# Patient Record
Sex: Male | Born: 2000 | Race: Black or African American | Hispanic: No | Marital: Single | Smoking: Never smoker
Health system: Southern US, Community
[De-identification: ages and names within clinical notes are randomized; demographics above are authoritative.]

## PROBLEM LIST (undated history)

## (undated) DIAGNOSIS — M62838 Other muscle spasm: Secondary | ICD-10-CM

---
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
## 2021-11-12 ENCOUNTER — Encounter (HOSPITAL_BASED_OUTPATIENT_CLINIC_OR_DEPARTMENT_OTHER): Payer: Self-pay | Admitting: Obstetrics and Gynecology

## 2021-11-12 ENCOUNTER — Emergency Department (HOSPITAL_BASED_OUTPATIENT_CLINIC_OR_DEPARTMENT_OTHER)
Admission: EM | Admit: 2021-11-12 | Discharge: 2021-11-12 | Disposition: A | Discharge status: Home / Self Care | Payer: Medicaid Other | Attending: Emergency Medicine | Admitting: Emergency Medicine

## 2021-11-12 ENCOUNTER — Emergency Department (HOSPITAL_BASED_OUTPATIENT_CLINIC_OR_DEPARTMENT_OTHER): Payer: Medicaid Other | Admitting: Radiology

## 2021-11-12 ENCOUNTER — Other Ambulatory Visit: Payer: Self-pay

## 2021-11-12 DIAGNOSIS — M545 Low back pain, unspecified: Secondary | ICD-10-CM | POA: Insufficient documentation

## 2021-11-12 HISTORY — DX: Other muscle spasm: M62.838

## 2021-11-12 MED ORDER — IBUPROFEN 800 MG PO TABS
800.0000 mg | ORAL_TABLET | Freq: Once | ORAL | Status: AC
  Administered 2021-11-12: 800 mg via ORAL
  Filled 2021-11-12: qty 1

## 2021-11-12 MED ORDER — METHOCARBAMOL 500 MG PO TABS: 500.0000 mg | ORAL_TABLET | Freq: Two times a day (BID) | ORAL | 0 refills | Status: AC

## 2021-11-12 MED ORDER — LIDOCAINE 5 % EX PTCH
1.0000 | MEDICATED_PATCH | CUTANEOUS | Status: DC
  Administered 2021-11-12: 1 via TRANSDERMAL
  Filled 2021-11-12: qty 1

## 2021-11-12 NOTE — Discharge Instructions (Addendum)
Take the roboxin at night, don't take before driving. You may hav e some muscle pain over the next few weeks, this is normal. Take tylenol and motrin for pain.  ?
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
## 2021-11-12 NOTE — ED Notes (Signed)
Discharge instructions, follow up care, and prescriptions reviewed and explained, pt verbalized understanding.  
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
## 2021-11-28 ENCOUNTER — Encounter (HOSPITAL_COMMUNITY): Payer: Self-pay | Admitting: Emergency Medicine

## 2021-11-28 ENCOUNTER — Ambulatory Visit (HOSPITAL_COMMUNITY)
Admission: EM | Admit: 2021-11-28 | Discharge: 2021-11-28 | Disposition: A | Discharge status: Home / Self Care | Payer: Medicaid Other | Attending: Nurse Practitioner | Admitting: Nurse Practitioner

## 2021-11-28 DIAGNOSIS — R110 Nausea: Secondary | ICD-10-CM | POA: Diagnosis not present

## 2021-11-28 DIAGNOSIS — Z20822 Contact with and (suspected) exposure to covid-19: Secondary | ICD-10-CM | POA: Insufficient documentation

## 2021-11-28 DIAGNOSIS — R197 Diarrhea, unspecified: Secondary | ICD-10-CM | POA: Insufficient documentation

## 2021-11-28 MED ORDER — ONDANSETRON 4 MG PO TBDP: 4.0000 mg | ORAL_TABLET | Freq: Three times a day (TID) | ORAL | 0 refills | Status: AC | PRN

## 2021-11-28 NOTE — ED Provider Notes (Signed)
?[facility] ? ? ? ?CSN: [id number] ?Arrival date & time: 11/28/21  1048 ? ? ?  ? ?History   ?Chief Complaint ?Chief Complaint  ?Patient presents with  ? Covid Exposure  ? Nausea  ? ? ?HPI ?Shane Reed is a 21 y.o. male.  ? ?Patient reports he was exposed to somebody who tested positive for COVID over the weekend.  He reports symptoms started on Monday.  He reports nausea and frequent diarrhea, body aches, chills, and congestion at nighttime.  He denies cough, headache, sinus pressure, ear pain or drainage, nausea/vomiting.  Denies chest pain shortness of breath.  He is having diarrhea however denies blood in his stool.  He is eating and drinking normally.  He has not taken anything for his upper respiratory symptoms.  He reports he missed work yesterday. ? ? ?Past Medical History:  ?Diagnosis Date  ? Muscle spasms of lower extremity   ? ? ?There are no problems to display for this patient. ? ? ?History reviewed. No pertinent surgical history. ? ? ? ? ?Home Medications   ? ?Prior to Admission medications   ?Medication Sig Start Date End Date Taking? Authorizing Provider  ?ibuprofen (ADVIL,MOTRIN) 400 MG tablet Take 1 tablet (400 mg total) by mouth every 6 (six) hours as needed. 05/29/18   Lorin Picket, NP  ?methocarbamol (ROBAXIN) 500 MG tablet Take 1 tablet (500 mg total) by mouth 2 (two) times daily. 11/12/21   Theron Arista, PA-C  ?ondansetron (ZOFRAN ODT) 4 MG disintegrating tablet Take 1 tablet (4 mg total) by mouth every 8 (eight) hours as needed for nausea or vomiting. 11/28/21   Valentino Nose, NP  ? ? ?Family History ?Family History  ?Problem Relation Age of Onset  ? Healthy Mother   ? Healthy Father   ? ? ?Social History ?Social History  ? ?Tobacco Use  ? Smoking status: Never  ? Smokeless tobacco: Never  ?Vaping Use  ? Vaping Use: Never used  ?Substance Use Topics  ? Alcohol use: Never  ? Drug use: Yes  ?  Types: Marijuana  ? ? ? ?Allergies   ?Patient has no known  allergies. ? ? ?Review of Systems ?Review of Systems ?Per HPI ? ?Physical Exam ?Triage Vital Signs ?ED Triage Vitals  ?Enc Vitals Group  ?   BP 11/28/21 1237 (!) 94/54  ?   Pulse Rate 11/28/21 1237 61  ?   Resp 11/28/21 1237 14  ?   Temp 11/28/21 1237 99.2 ?F (37.3 ?C)  ?   Temp Source 11/28/21 1237 Oral  ?   SpO2 11/28/21 1237 98 %  ?   Weight --   ?   Height --   ?   Head Circumference --   ?   Peak Flow --   ?   Pain Score 11/28/21 1236 0  ?   Pain Loc --   ?   Pain Edu? --   ?   Excl. in GC? --   ? ?No data found. ? ?Updated Vital Signs ?BP (!) 94/54 (BP Location: Right Arm)   Pulse 61   Temp 99.2 ?F (37.3 ?C) (Oral)   Resp 14   SpO2 98%  ? ?Visual Acuity ?Right Eye Distance:   ?Left Eye Distance:   ?Bilateral Distance:   ? ?Right Eye Near:   ?Left Eye Near:    ?Bilateral Near:    ? ?Physical Exam ?Vitals and nursing note reviewed.  ?Constitutional:   ?  General: He is not in acute distress. ?   Appearance: Normal appearance. He is not ill-appearing or toxic-appearing.  ?HENT:  ?   Head: Normocephalic and atraumatic.  ?   Right Ear: Tympanic membrane, ear canal and external ear normal.  ?   Left Ear: Tympanic membrane, ear canal and external ear normal.  ?   Nose: Congestion present. No rhinorrhea.  ?   Mouth/Throat:  ?   Mouth: Mucous membranes are moist.  ?   Pharynx: Oropharynx is clear. Posterior oropharyngeal erythema present. No oropharyngeal exudate.  ?Eyes:  ?   General: No scleral icterus. ?   Extraocular Movements: Extraocular movements intact.  ?Cardiovascular:  ?   Rate and Rhythm: Normal rate and regular rhythm.  ?Pulmonary:  ?   Effort: Pulmonary effort is normal. No respiratory distress.  ?   Breath sounds: Normal breath sounds. No wheezing, rhonchi or rales.  ?Abdominal:  ?   General: Abdomen is flat. Bowel sounds are normal. There is no distension.  ?   Palpations: Abdomen is soft.  ?Musculoskeletal:  ?   Cervical back: Normal range of motion and neck supple.  ?Lymphadenopathy:  ?    Cervical: No cervical adenopathy.  ?Skin: ?   General: Skin is warm and dry.  ?   Coloration: Skin is not jaundiced or pale.  ?   Findings: No erythema or rash.  ?Neurological:  ?   Mental Status: He is alert and oriented to person, place, and time.  ?   Motor: No weakness.  ? ? ? ?UC Treatments / Results  ?Labs ?(all labs ordered are listed, but only abnormal results are displayed) ?Labs Reviewed  ?SARS CORONAVIRUS 2 (TAT 6-24 HRS)  ? ? ?EKG ? ? ?Radiology ?No results found. ? ?Procedures ?Procedures (including critical care time) ? ?Medications Ordered in UC ?Medications - No data to display ? ?Initial Impression / Assessment and Plan / UC Course  ?I have reviewed the triage vital signs and the nursing notes. ? ?Pertinent labs & imaging results that were available during my care of the patient were reviewed by me and considered in my medical decision making (see chart for details). ? ?  ?Reassured patient that symptoms and exam findings are most consistent with a viral upper respiratory infection.  Obtain COVID-19 testing.  Continue supportive care. Increase fluid intake with water or electrolyte solution like pedialyte. Encouraged acetaminophen as needed for fever/pain. Encouraged salt water gargling, chloraseptic spray and throat lozenges. Encouraged OTC guaifenesin. Encouraged saline sinus flushes and/or neti with humidified air.  Can use ondansetron 4 mg under the tongue every 8 hours as needed for nausea.  Note given for work.   ? ?Final Clinical Impressions(s) / UC Diagnoses  ? ?Final diagnoses:  ?Nausea without vomiting  ?Diarrhea, unspecified type  ?Exposure to COVID-19 virus  ? ? ? ?Discharge Instructions   ? ?  ?Your symptoms and exam findings are most consistent with a viral upper respiratory infection. These usually run their course in about 10 days.  If your symptoms last longer than 10 days without improvement, please follow up with your primary care provider.  If your symptoms, worsen, please go to  the Emergency Room.   ? ?We have tested you today for COVID-19.  You will see the results in Mychart and we will call you with positive results.    Please stay home and isolate until you are aware of the results.   ? ?Some things that can make you feel  better are: ?- Increased rest ?- Increasing fluid with water/sugar free electrolytes ?- Acetaminophen and ibuprofen as needed for fever/pain.  ?- Salt water gargling, chloraseptic spray and throat lozenges ?- OTC guaifenesin (Mucinex).  ?- Saline sinus flushes or a neti pot.  ?- Humidifying the air. ? ? ? ? ? ?ED Prescriptions   ? ? Medication Sig Dispense Auth. Provider  ? ondansetron (ZOFRAN ODT) 4 MG disintegrating tablet Take 1 tablet (4 mg total) by mouth every 8 (eight) hours as needed for nausea or vomiting. 20 tablet Valentino Nose, NP  ? ?  ? ?PDMP not reviewed this encounter. ?  ?Valentino Nose, NP ?11/28/21 1306 ? ?

## 2021-11-28 NOTE — ED Triage Notes (Signed)
Pt reports around family that has covid. Pt reports since Monday having nausea when at work. Reports that missed work yesterday and needing note.  ?

## 2021-11-28 NOTE — Discharge Instructions (Addendum)
Your symptoms and exam findings are most consistent with a viral upper respiratory infection. These usually run their course in about 10 days.  If your symptoms last longer than 10 days without improvement, please follow up with your primary care provider.  If your symptoms, worsen, please go to the Emergency Room.    We have tested you today for COVID-19.  You will see the results in Mychart and we will call you with positive results.    Please stay home and isolate until you are aware of the results.    Some things that can make you feel better are: - Increased rest - Increasing fluid with water/sugar free electrolytes - Acetaminophen and ibuprofen as needed for fever/pain.  - Salt water gargling, chloraseptic spray and throat lozenges - OTC guaifenesin (Mucinex).  - Saline sinus flushes or a neti pot.  - Humidifying the air.  

## 2021-11-29 LAB — SARS CORONAVIRUS 2 (TAT 6-24 HRS): SARS Coronavirus 2: NEGATIVE

## 2022-08-30 ENCOUNTER — Ambulatory Visit: Payer: Medicaid Other | Attending: Obstetrics and Gynecology

## 2022-08-30 DIAGNOSIS — Z31440 Encounter of male for testing for genetic disease carrier status for procreative management: Secondary | ICD-10-CM

## 2022-09-11 LAB — HORIZON CUSTOM: REPORT SUMMARY: NEGATIVE

## 2022-09-12 ENCOUNTER — Telehealth: Payer: Self-pay | Admitting: Genetics

## 2022-09-12 NOTE — Telephone Encounter (Signed)
Called Shane Reed to return his negative alpha thalassemia carrier screening results. Left voicemail with [facility] call back number.
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
